# Patient Record
Sex: Male | Born: 1955 | Race: Black or African American | Hispanic: No | Smoking: Never smoker
Health system: Southern US, Community
[De-identification: ages and names within clinical notes are randomized; demographics above are authoritative.]

---

## 2006-06-09 ENCOUNTER — Emergency Department: Payer: Self-pay | Admitting: Emergency Medicine

## 2006-07-18 ENCOUNTER — Other Ambulatory Visit: Payer: Self-pay

## 2006-07-18 ENCOUNTER — Emergency Department: Payer: Self-pay

## 2006-10-09 ENCOUNTER — Emergency Department: Payer: Self-pay | Admitting: Emergency Medicine

## 2009-03-24 ENCOUNTER — Emergency Department: Payer: Self-pay | Admitting: Emergency Medicine

## 2012-01-15 ENCOUNTER — Emergency Department: Payer: Self-pay | Admitting: Emergency Medicine

## 2012-01-15 LAB — COMPREHENSIVE METABOLIC PANEL
Albumin: 3.7 g/dL (ref 3.4–5.0)
Anion Gap: 6 — ABNORMAL LOW (ref 7–16)
Bilirubin,Total: 0.7 mg/dL (ref 0.2–1.0)
Calcium, Total: 8.4 mg/dL — ABNORMAL LOW (ref 8.5–10.1)
Chloride: 107 mmol/L (ref 98–107)
Co2: 27 mmol/L (ref 21–32)
EGFR (African American): >60
EGFR (Non-African Amer.): >60
Osmolality: 279 (ref 275–301)
Potassium: 4.1 mmol/L (ref 3.5–5.1)
SGOT(AST): 26 U/L (ref 15–37)
Sodium: 140 mmol/L (ref 136–145)

## 2012-01-15 LAB — CBC
HCT: 43 % (ref 40.0–52.0)
MCHC: 34.6 g/dL (ref 32.0–36.0)
RBC: 4.8 10*6/uL (ref 4.40–5.90)
RDW: 13.7 % (ref 11.5–14.5)
WBC: 5.7 10*3/uL (ref 3.8–10.6)

## 2012-01-15 LAB — URINALYSIS, COMPLETE
Bilirubin,UR: NEGATIVE
Leukocyte Esterase: NEGATIVE
Ph: 6 (ref 4.5–8.0)
Protein: NEGATIVE
RBC,UR: NONE SEEN /HPF (ref 0–5)
WBC UR: 1 /HPF (ref 0–5)

## 2012-01-15 LAB — CK TOTAL AND CKMB (NOT AT ARMC)
CK, Total: 722 U/L — ABNORMAL HIGH (ref 35–232)
CK-MB: 2.4 ng/mL (ref 0.5–3.6)

## 2012-10-09 ENCOUNTER — Emergency Department: Payer: Self-pay | Admitting: Emergency Medicine

## 2012-10-09 LAB — COMPREHENSIVE METABOLIC PANEL
Albumin: 3.9 g/dL (ref 3.4–5.0)
Alkaline Phosphatase: 81 U/L (ref 50–136)
Anion Gap: 5 — ABNORMAL LOW (ref 7–16)
BUN: 15 mg/dL (ref 7–18)
Bilirubin,Total: 0.5 mg/dL (ref 0.2–1.0)
Calcium, Total: 8.7 mg/dL (ref 8.5–10.1)
Chloride: 103 mmol/L (ref 98–107)
Co2: 30 mmol/L (ref 21–32)
Creatinine: 1.31 mg/dL — ABNORMAL HIGH (ref 0.60–1.30)
EGFR (African American): >60
EGFR (Non-African Amer.): >60
Glucose: 87 mg/dL (ref 65–99)
Osmolality: 276 (ref 275–301)
Potassium: 3.5 mmol/L (ref 3.5–5.1)
SGOT(AST): 22 U/L (ref 15–37)
SGPT (ALT): 23 U/L (ref 12–78)
Sodium: 138 mmol/L (ref 136–145)
Total Protein: 7.3 g/dL (ref 6.4–8.2)

## 2012-10-09 LAB — CBC
HCT: 41.6 % (ref 40.0–52.0)
HGB: 14.1 g/dL (ref 13.0–18.0)
MCH: 30.4 pg (ref 26.0–34.0)
MCHC: 33.9 g/dL (ref 32.0–36.0)
MCV: 90 fL (ref 80–100)
Platelet: 138 10*3/uL — ABNORMAL LOW (ref 150–440)
RBC: 4.63 10*6/uL (ref 4.40–5.90)
RDW: 13.5 % (ref 11.5–14.5)
WBC: 9 10*3/uL (ref 3.8–10.6)

## 2012-10-09 LAB — TROPONIN I: Troponin-I: 0.03 ng/mL

## 2012-10-09 LAB — CK TOTAL AND CKMB (NOT AT ARMC)
CK, Total: 205 U/L (ref 35–232)
CK-MB: 0.7 ng/mL (ref 0.5–3.6)

## 2013-05-31 ENCOUNTER — Emergency Department: Payer: Self-pay | Admitting: Emergency Medicine

## 2013-05-31 LAB — URINALYSIS, COMPLETE
Bacteria: NONE SEEN
Bilirubin,UR: NEGATIVE
Blood: NEGATIVE
Glucose,UR: NEGATIVE mg/dL (ref 0–75)
Ketone: NEGATIVE
Leukocyte Esterase: NEGATIVE
Nitrite: NEGATIVE
Protein: NEGATIVE
RBC,UR: 1 /HPF (ref 0–5)
Squamous Epithelial: <1

## 2013-05-31 LAB — COMPREHENSIVE METABOLIC PANEL
Anion Gap: 7 (ref 7–16)
BUN: 21 mg/dL — ABNORMAL HIGH (ref 7–18)
Bilirubin,Total: 0.6 mg/dL (ref 0.2–1.0)
Calcium, Total: 8.6 mg/dL (ref 8.5–10.1)
Co2: 26 mmol/L (ref 21–32)
Creatinine: 1.38 mg/dL — ABNORMAL HIGH (ref 0.60–1.30)
EGFR (African American): >60
SGPT (ALT): 29 U/L (ref 12–78)
Total Protein: 7.7 g/dL (ref 6.4–8.2)

## 2013-05-31 LAB — TROPONIN I: Troponin-I: <0.02 ng/mL

## 2014-09-13 IMAGING — CT CT HEAD WITHOUT CONTRAST
1 series · 16 of 30 positions shown, 20 images · non-contrast
Comparison: none

REASON FOR EXAM: headache
COMMENTS:

PROCEDURE:     CT  - CT HEAD WITHOUT CONTRAST  - October 09, 2012  [DATE]
RESULT:     Comparison:  None
TECHNIQUE: Multiple axial images from the foramen magnum to the vertex were
obtained without IV contrast.

[Series 2: soft tissue · axial · 0.43mm/px · z∈[+348,+488]mm · 16 of 32 slices shown, 20 images]
[im 2/32  brain]
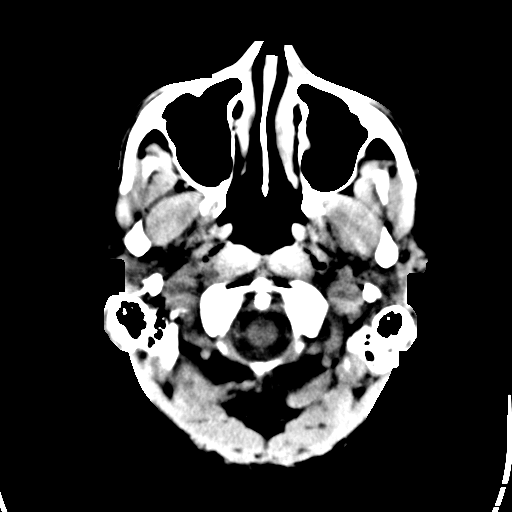
[im 2/32  bone]
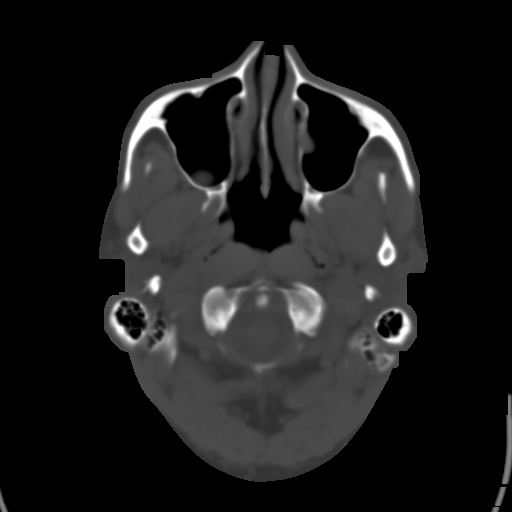
[im 4/32  brain]
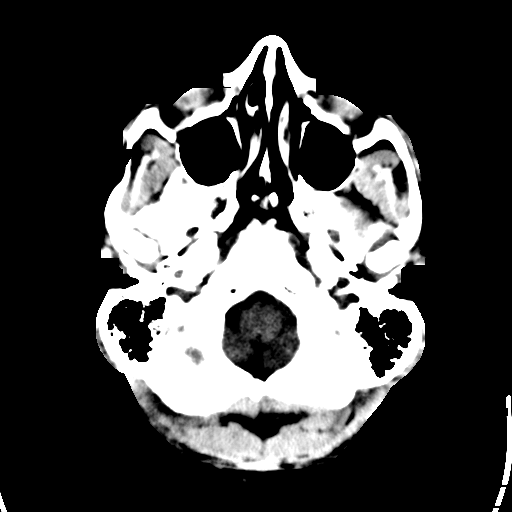
[im 6/32  brain]
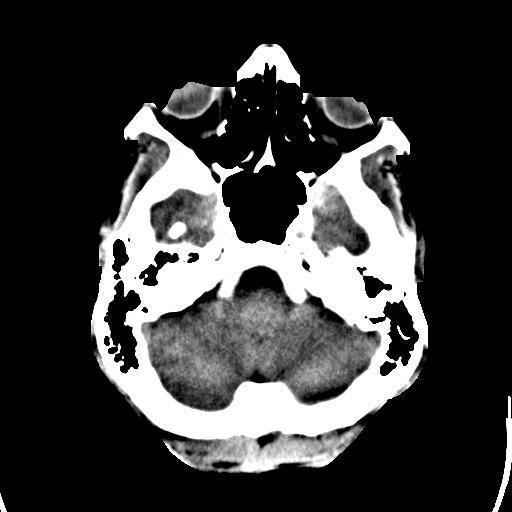
[im 8/32  brain]
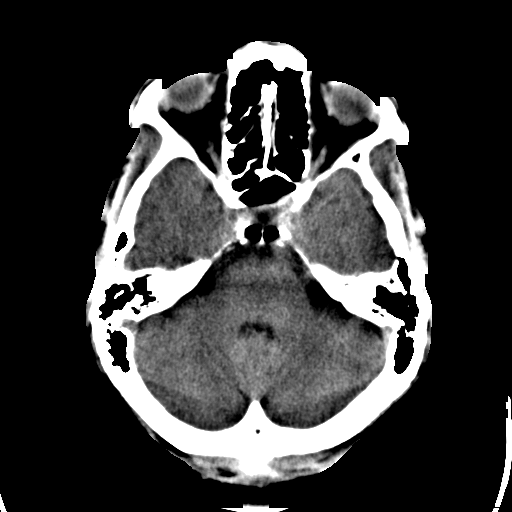
[im 9/32  brain]
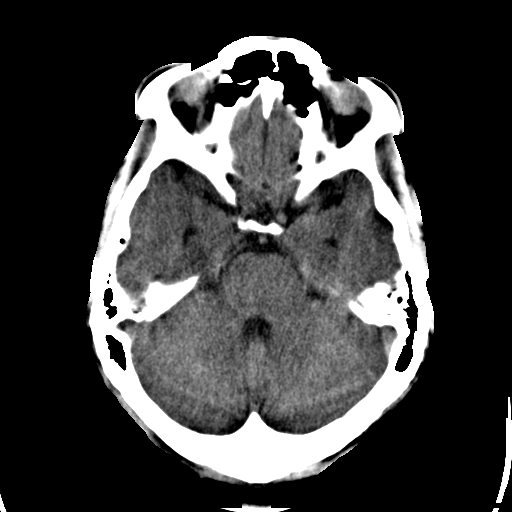
[im 9/32  bone]
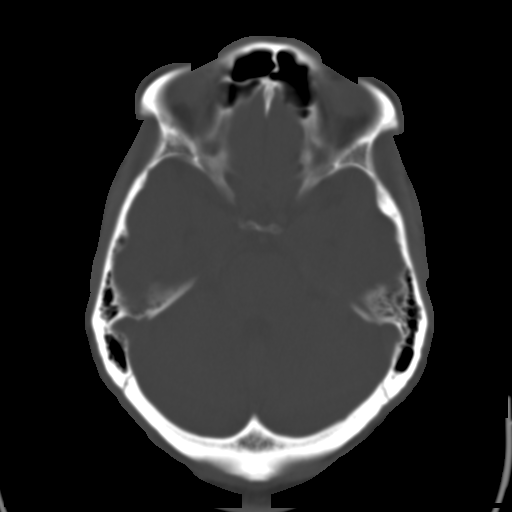
[im 11/32  brain]
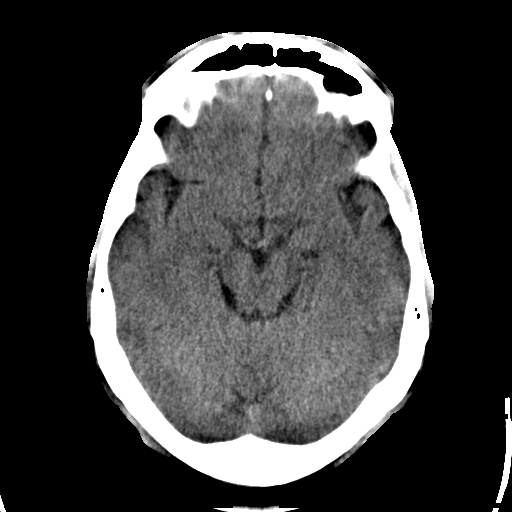
[im 13/32  brain]
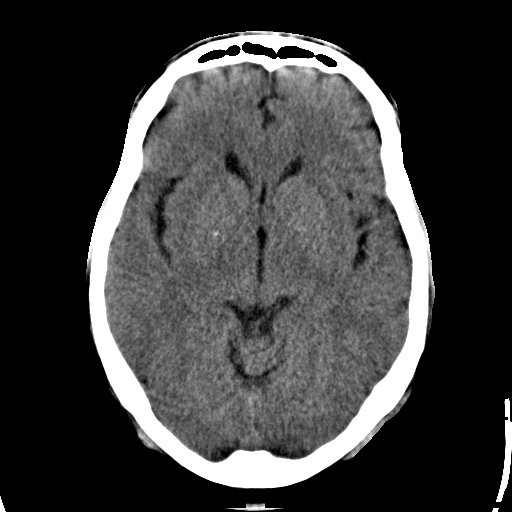
[im 15/32  brain]
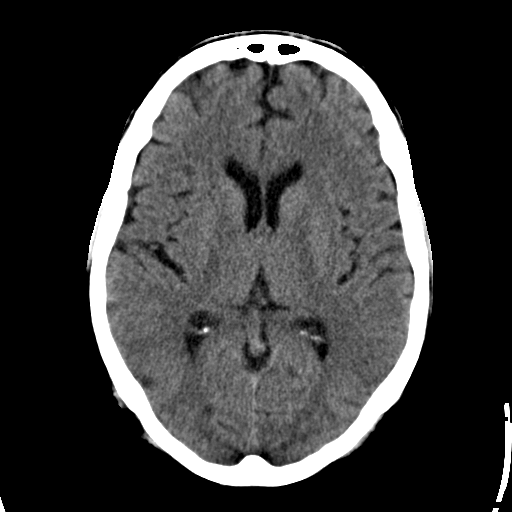
[im 17/32  brain]
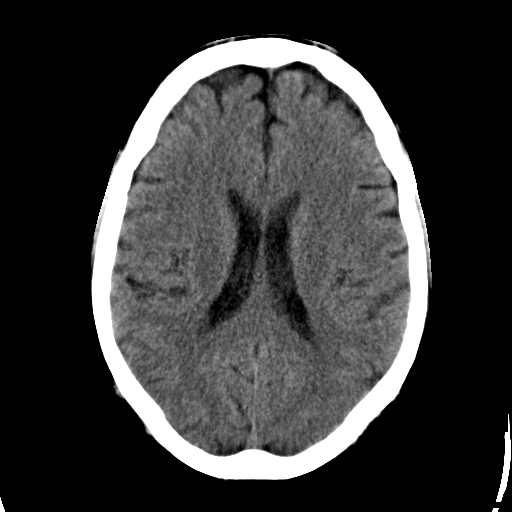
[im 17/32  bone]
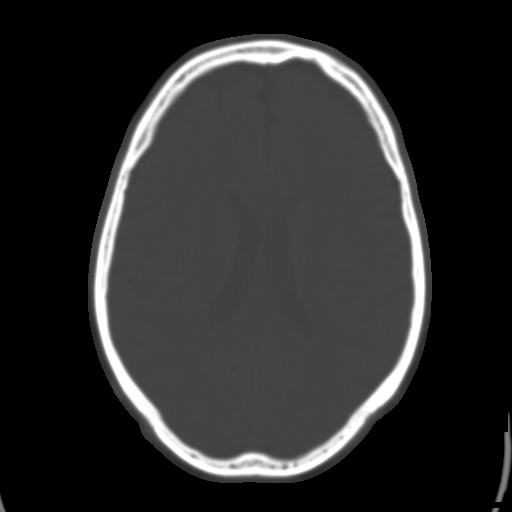
[im 19/32  brain]
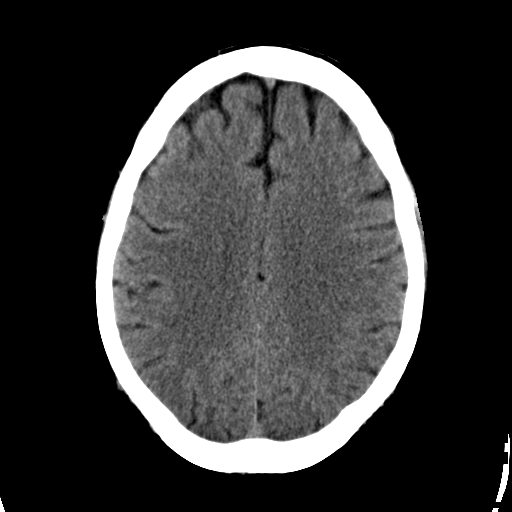
[im 21/32  brain]
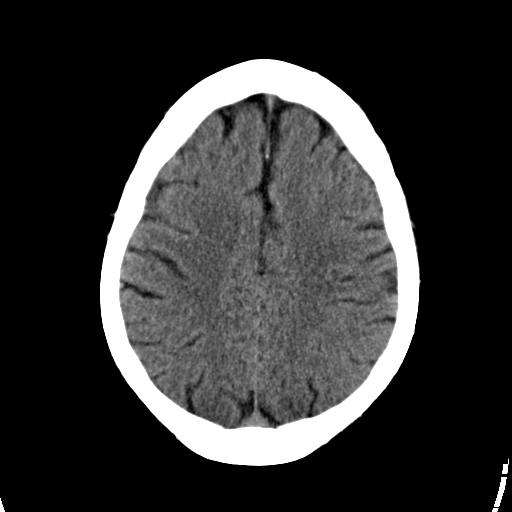
[im 23/32  brain]
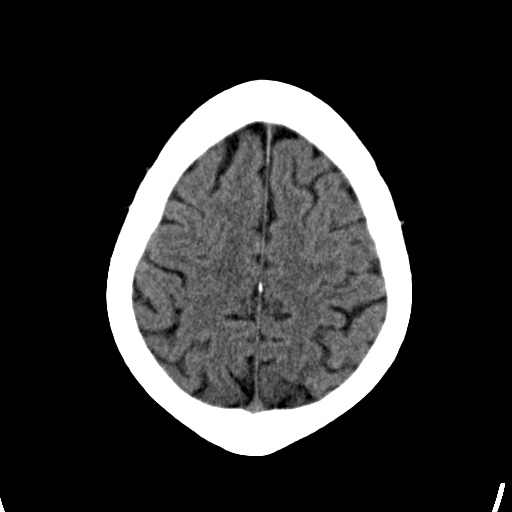
[im 24/32  brain]
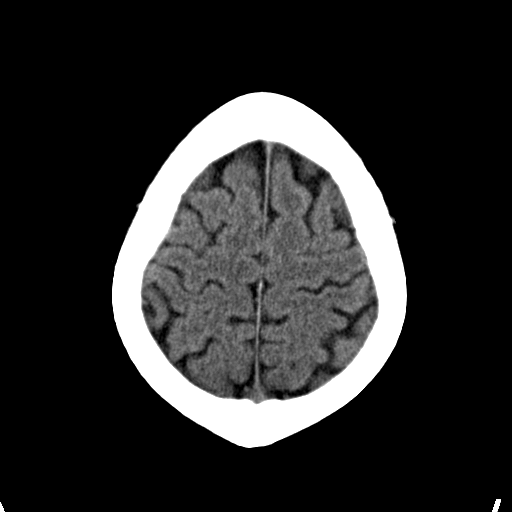
[im 24/32  bone]
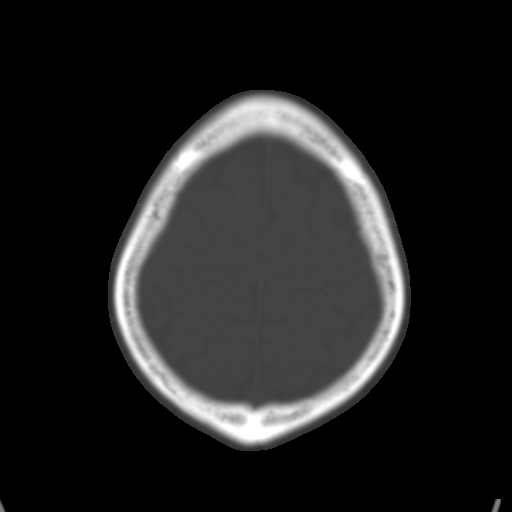
[im 26/32  brain]
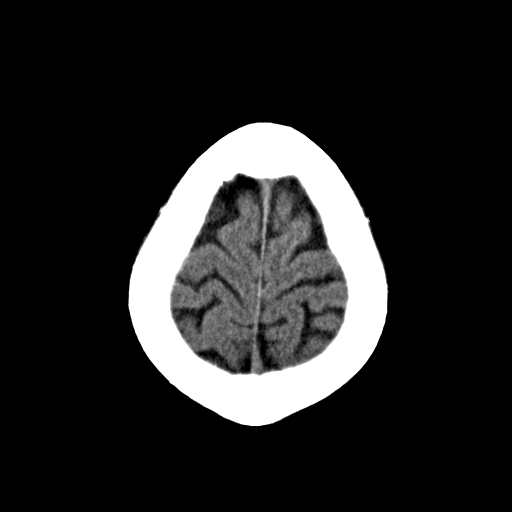
[im 28/32  brain]
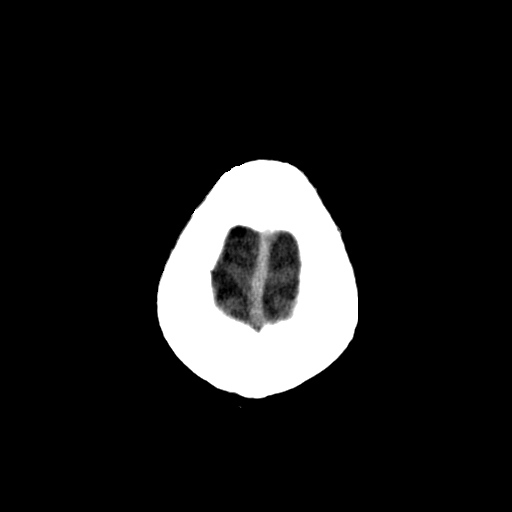
[im 30/32  brain]
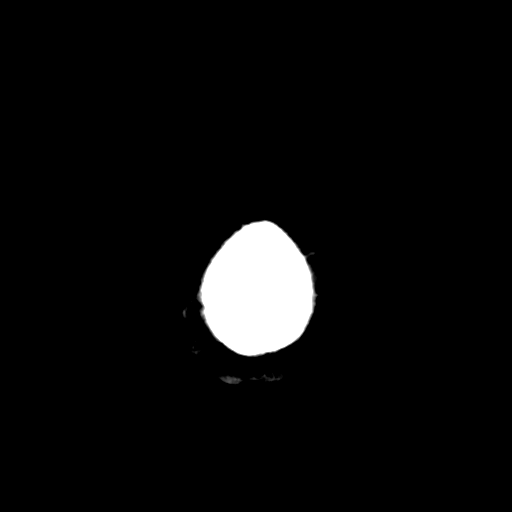

[16 of 30 positions shown; findings below may reference images not displayed]

FINDINGS: There is no evidence of mass effect, midline shift, or extra-axial fluid
collections.  There is no evidence of a space-occupying lesion or
intracranial hemorrhage. There is no evidence of a cortical-based area of
acute infarction.

The ventricles and sulci are appropriate for the patient's age. The basal
cisterns are patent.

Visualized portions of the orbits are unremarkable. There is a there is a
small right maxillary sinus mucus retention cyst.

The osseous structures are unremarkable.
IMPRESSION: No acute intracranial process.

[REDACTED]

## 2016-06-11 ENCOUNTER — Encounter: Payer: Self-pay | Admitting: Emergency Medicine

## 2016-06-11 ENCOUNTER — Emergency Department
Admission: EM | Admit: 2016-06-11 | Discharge: 2016-06-11 | Disposition: A | Discharge status: Home / Self Care | Payer: PRIVATE HEALTH INSURANCE | Attending: Emergency Medicine | Admitting: Emergency Medicine

## 2016-06-11 DIAGNOSIS — Y999 Unspecified external cause status: Secondary | ICD-10-CM | POA: Diagnosis not present

## 2016-06-11 DIAGNOSIS — S60561A Insect bite (nonvenomous) of right hand, initial encounter: Secondary | ICD-10-CM | POA: Diagnosis not present

## 2016-06-11 DIAGNOSIS — Y929 Unspecified place or not applicable: Secondary | ICD-10-CM | POA: Diagnosis not present

## 2016-06-11 DIAGNOSIS — S60562A Insect bite (nonvenomous) of left hand, initial encounter: Secondary | ICD-10-CM | POA: Diagnosis not present

## 2016-06-11 DIAGNOSIS — R21 Rash and other nonspecific skin eruption: Secondary | ICD-10-CM | POA: Diagnosis present

## 2016-06-11 DIAGNOSIS — W57XXXA Bitten or stung by nonvenomous insect and other nonvenomous arthropods, initial encounter: Secondary | ICD-10-CM | POA: Insufficient documentation

## 2016-06-11 DIAGNOSIS — L509 Urticaria, unspecified: Secondary | ICD-10-CM

## 2016-06-11 DIAGNOSIS — Y939 Activity, unspecified: Secondary | ICD-10-CM | POA: Insufficient documentation

## 2016-06-11 MED ORDER — HYDROXYZINE PAMOATE 25 MG PO CAPS: 25.0000 mg | ORAL_CAPSULE | Freq: Four times a day (QID) | ORAL | 0 refills | Status: AC | PRN

## 2016-06-11 MED ORDER — DEXAMETHASONE SODIUM PHOSPHATE 10 MG/ML IJ SOLN
10.0000 mg | Freq: Once | INTRAMUSCULAR | Status: AC
  Administered 2016-06-11: 10 mg via INTRAMUSCULAR
  Filled 2016-06-11: qty 1

## 2016-06-11 NOTE — ED Triage Notes (Signed)
Patient presents to the ED with rash to his bilateral hands and wrists after getting bit by ants yesterday.  Very small occasional bumps can be seen to wrist.  No obvious distress at this time.

## 2016-06-11 NOTE — ED Provider Notes (Signed)
[facility] Emergency Department Provider Note  ____________________________________________  Time seen: Approximately 3:09 PM  I have reviewed the triage vital signs and the nursing notes.   HISTORY  Chief Complaint Rash    HPI James RaringStacey L Wallenstein Sr. is a 60 y.o. male , NAD, presents to the right prominent one-day history of rash, itching and swelling about his bilateral hands. Patient states he was bitten multiple times by large black ants yesterday. States the areas have been itching significantly but today he woke with swelling about the back of both hands. Denies any bites, swelling or redness to any other part of his body. Has had no numbness, wheezes, tingling. Denies any oozing or weeping. No headaches, fevers, chills or body aches. Has had no chest pain, shortness breath, swelling about the lips/tongue/throat. Has not taken anything over-the-counter for his symptoms at this time.   History reviewed. No pertinent past medical history.  There are no active problems to display for this patient.   History reviewed. No pertinent surgical history.  Prior to Admission medications   Medication Sig Start Date End Date Taking? Authorizing Provider  hydrOXYzine (VISTARIL) 25 MG capsule Take 1 capsule (25 mg total) by mouth every 6 (six) hours as needed for itching. 06/11/16   Fenna Semel L Latressa Harries, PA-C    Allergies Patient has no known allergies.  No family history on file.  Social History Social History  Substance Use Topics  . Smoking status: Never Smoker  . Smokeless tobacco: Never Used  . Alcohol use No     Review of Systems  Constitutional: No fever/chills ENT: No Swelling about the lips/tongue/throat. Cardiovascular: No chest pain. Respiratory: No shortness of breath. Musculoskeletal: Negative for Forearm, wrist, hand, finger pain.  Skin: Positive for rash and swelling about bilateral hands. No oozing, weeping or bleeding. Neurological: Negative  for Numbness, weakness, tingling  ____________________________________________   PHYSICAL EXAM:  VITAL SIGNS: ED Triage Vitals  Enc Vitals Group     BP 06/11/16 1430 (!) 164/105     Pulse Rate 06/11/16 1430 77     Resp 06/11/16 1430 18     Temp 06/11/16 1430 97.8 F (36.6 C)     Temp Source 06/11/16 1430 Oral     SpO2 06/11/16 1430 97 %     Weight 06/11/16 1426 210 lb (95.3 kg)     Height 06/11/16 1426 6' (1.829 m)     Head Circumference --      Peak Flow --      Pain Score 06/11/16 1426 6     Pain Loc --      Pain Edu? --      Excl. in GC? --      Constitutional: Alert and oriented. Well appearing and in no acute distress. Eyes: Conjunctivae are normal.  Head: Atraumatic. Neck: Supple with full range of motion. Hematological/Lymphatic/Immunilogical: No cervical lymphadenopathy. Cardiovascular: Good peripheral circulation with 2+ pulses noted in bilateral upper extremities. Capillary refill is brisk in all digits of bilateral hands. Respiratory: Normal respiratory effort without tachypnea or retractions.  Musculoskeletal: Full range of motion of bilateral upper extremities without pain or difficulty. Neurologic:  Normal speech and language. No gross focal neurologic deficits are appreciated.  Skin:  Multiple papular lesions noted about the bilateral distal forearms, wrists, hands and fingers. Mild edema noted about the dorsal portion of the left hand and wrist. Mild tenderness to palpation of this area but otherwise no other tenderness. No active oozing, weeping or bleeding. Skin  is warm, dry and intact.  Psychiatric: Mood and affect are normal. Speech and behavior are normal. Patient exhibits appropriate insight and judgement.   ____________________________________________    LABS  None ____________________________________________  EKG  None ____________________________________________  RADIOLOGY  None ____________________________________________    PROCEDURES  Procedure(s) performed: None   Procedures   Medications  dexamethasone (DECADRON) injection 10 mg (10 mg Intramuscular Given 06/11/16 1546)    ____________________________________________   INITIAL IMPRESSION / ASSESSMENT AND PLAN / ED COURSE  Pertinent labs & imaging results that were available during my care of the patient were reviewed by me and considered in my medical decision making (see chart for details).  Clinical Course     Patient's diagnosis is consistent with Insect bites causing urticaria. Patient was given IM Decadron in the emergency department and tolerated well without side effects. Patient will be discharged home with prescriptions for Vistaril to use as needed for itching. [facility] if symptoms persist past this treatment course. Patient is given ED precautions to return to the ED for any worsening or new symptoms.    ____________________________________________  FINAL CLINICAL IMPRESSION(S) / ED DIAGNOSES  Final diagnoses:  Insect bite, initial encounter  Urticaria      NEW MEDICATIONS STARTED DURING THIS VISIT:  New Prescriptions   HYDROXYZINE (VISTARIL) 25 MG CAPSULE    Take 1 capsule (25 mg total) by mouth every 6 (six) hours as needed for itching.         Hope PigeonJami L Rilya Longo, PA-C 06/11/16 1617    [name] Matthew Schaevitz, MD 06/11/16 1728

## 2021-09-19 ENCOUNTER — Other Ambulatory Visit: Payer: Self-pay

## 2021-09-19 ENCOUNTER — Encounter: Payer: Self-pay | Admitting: Emergency Medicine

## 2021-09-19 ENCOUNTER — Emergency Department: Payer: No Typology Code available for payment source

## 2021-09-19 DIAGNOSIS — S99912A Unspecified injury of left ankle, initial encounter: Secondary | ICD-10-CM | POA: Diagnosis present

## 2021-09-19 DIAGNOSIS — X509XXA Other and unspecified overexertion or strenuous movements or postures, initial encounter: Secondary | ICD-10-CM | POA: Insufficient documentation

## 2021-09-19 DIAGNOSIS — S93402A Sprain of unspecified ligament of left ankle, initial encounter: Secondary | ICD-10-CM | POA: Diagnosis not present

## 2021-09-19 NOTE — ED Triage Notes (Signed)
Pt ambulatory to triage, limping gait with aid of cane; st sprained his left ankle today after stepping into a hole ?

## 2021-09-20 ENCOUNTER — Emergency Department
Admission: EM | Admit: 2021-09-20 | Discharge: 2021-09-20 | Disposition: A | Discharge status: Home / Self Care | Payer: No Typology Code available for payment source | Attending: Emergency Medicine | Admitting: Emergency Medicine

## 2021-09-20 DIAGNOSIS — S93402A Sprain of unspecified ligament of left ankle, initial encounter: Secondary | ICD-10-CM

## 2021-09-20 MED ORDER — KETOROLAC TROMETHAMINE 60 MG/2ML IM SOLN
30.0000 mg | Freq: Once | INTRAMUSCULAR | Status: AC
  Administered 2021-09-20: 30 mg via INTRAMUSCULAR
  Filled 2021-09-20: qty 2

## 2021-09-20 MED ORDER — HYDROCODONE-ACETAMINOPHEN 5-325 MG PO TABS: 1.0000 | ORAL_TABLET | Freq: Four times a day (QID) | ORAL | 0 refills | Status: AC | PRN

## 2021-09-20 NOTE — Discharge Instructions (Signed)
1.  You may take ibuprofen as needed for pain; Norco as needed for more severe pain. ?2.  Elevated affected area and apply ice over splint several times daily. ?3.  Use splint as needed for comfort. ?4.  Return to the ER for worsening symptoms, persistent vomiting, difficulty breathing or other concerns. ?

## 2021-09-20 NOTE — ED Notes (Signed)
Aircast applied to left ankle.

## 2021-09-20 NOTE — ED Provider Notes (Signed)
? ?[facility] ?Provider Note ? ? ? Event Date/Time  ? First MD Initiated Contact with Patient 09/20/21 [phone]   ?  (approximate) ? ? ?History  ? ?Ankle Pain ? ? ?HPI ? ?James Raring Sr. is a 66 y.o. male who presents to the ED from home with a chief complaint of left ankle injury.  Patient ambulates with a cane, was at his granddaughter's track meet yesterday afternoon when he stepped in a hole and twisted his left ankle.  Presents with pain and swelling to that ankle.  Has a boot from a prior injury which he is wearing which helps with the pain somewhat.  Ambulates with cane at baseline.  Denies striking head or LOC.  Voices no other complaints or injuries. ?  ? ? ?Past Medical History  ?History reviewed. No pertinent past medical history. ? ? ?Active Problem List  ?There are no problems to display for this patient. ? ? ? ?Past Surgical History  ?History reviewed. No pertinent surgical history. ? ? ?Home Medications  ? ?Prior to Admission medications   ?Medication Sig Start Date End Date Taking? Authorizing Provider  ?HYDROcodone-acetaminophen (NORCO) 5-325 MG tablet Take 1 tablet by mouth every 6 (six) hours as needed for moderate pain. 09/20/21  Yes Irean Hong, MD  ?hydrOXYzine (VISTARIL) 25 MG capsule Take 1 capsule (25 mg total) by mouth every 6 (six) hours as needed for itching. 06/11/16   Hagler, Jami L, PA-C  ? ? ? ?Allergies  ?Patient has no known allergies. ? ? ?Family History  ?No family history on file. ? ? ?Physical Exam  ?Triage Vital Signs: ?ED Triage Vitals  ?Enc Vitals Group  ?   BP 09/19/21 2309 132/85  ?   Pulse Rate 09/19/21 2309 (!) 53  ?   Resp 09/19/21 2309 18  ?   Temp 09/19/21 2309 97.9 ?F (36.6 ?C)  ?   Temp Source 09/19/21 2309 Oral  ?   SpO2 09/19/21 2309 93 %  ?   Weight 09/19/21 2303 201 lb (91.2 kg)  ?   Height 09/19/21 2303 6' (1.829 m)  ?   Head Circumference --   ?   Peak Flow --   ?   Pain Score 09/19/21 2303 10  ?   Pain Loc --   ?   Pain Edu? --   ?    Excl. in GC? --   ? ? ?Updated Vital Signs: ?BP 132/85 (BP Location: Right Arm)   Pulse (!) 53   Temp 97.9 ?F (36.6 ?C) (Oral)   Resp 18   Ht 6' (1.829 m)   Wt 91.2 kg   SpO2 93%   BMI 27.26 kg/m?  ? ? ?General: Awake, no distress.  ?CV:  Good peripheral perfusion.  ?Resp:  Normal effort.  ?Abd:  No distention.  ?Other:  Boot removed from left lower leg: Mild swelling to lateral malleolus which is tender to palpation.  Limited range of motion secondary to pain.  2+ distal pulses.  Brisk, less than 5-second capillary refill. ? ? ?ED Results / Procedures / Treatments  ?Labs ?(all labs ordered are listed, but only abnormal results are displayed) ?Labs Reviewed - No data to display ? ? ?EKG ? ?None ? ? ?RADIOLOGY ?I have independently visualized and reviewed patient's ankle x-ray as well as noted the radiology interpretation: ? ?Left ankle x-ray: No acute bony abnormalities ? ?Official radiology report(s): ?DG Ankle Complete Left ? ?Result Date: 09/19/2021 ?CLINICAL DATA:  Injury. Limping gait. Sprained ankle after stepping into a hole today. EXAM: LEFT ANKLE COMPLETE - 3+ VIEW COMPARISON:  None. FINDINGS: Left ankle appears intact. Fixation screws demonstrated in the first and fifth metatarsal bones. No evidence of acute fracture or subluxation. No focal bone lesion or bone destruction. Bone cortex and trabecular architecture appear intact. Vascular calcifications. No radiopaque soft tissue foreign bodies. IMPRESSION: No acute bony abnormalities. Electronically Signed   By: Burman Nieves M.D.   On: 09/19/2021 23:58   ? ? ?PROCEDURES: ? ?Critical Care performed: No ? ?Procedures ? ? ?MEDICATIONS ORDERED IN ED: ?Medications  ?ketorolac (TORADOL) injection 30 mg (has no administration in time range)  ? ? ? ?IMPRESSION / MDM / ASSESSMENT AND PLAN / ED COURSE  ?I reviewed the triage vital signs and the nursing notes. ?             ?               ?66 year old male who presents with left ankle sprain.  He is driving  so we will give IM ketorolac now.  Will discharge home with prescription for Norco to use as needed.  We will follow-up with podiatry.  Gave patient the choice to continue to use his boot which is rather bulky or try ankle stirrup brace which patient prefers.  Offered walker but states he will stick with his cane.  Strict return precautions given.  Patient verbalizes understanding and agrees with plan of care. ? ?FINAL CLINICAL IMPRESSION(S) / ED DIAGNOSES  ? ?Final diagnoses:  ?Sprain of left ankle, unspecified ligament, initial encounter  ? ? ? ?Rx / DC Orders  ? ?ED Discharge Orders   ? ?      Ordered  ?  HYDROcodone-acetaminophen (NORCO) 5-325 MG tablet  Every 6 hours PRN       ? 09/20/21 0227  ? ?  ?  ? ?  ? ? ? ?Note:  This document was prepared using Dragon voice recognition software and may include unintentional dictation errors. ?  ?Irean Hong, MD ?09/20/21 [phone] ? ?

## 2022-03-31 ENCOUNTER — Other Ambulatory Visit: Payer: Self-pay

## 2022-03-31 DIAGNOSIS — T83098A Other mechanical complication of other indwelling urethral catheter, initial encounter: Secondary | ICD-10-CM | POA: Insufficient documentation

## 2022-03-31 DIAGNOSIS — I10 Essential (primary) hypertension: Secondary | ICD-10-CM | POA: Diagnosis not present

## 2022-03-31 DIAGNOSIS — N4889 Other specified disorders of penis: Secondary | ICD-10-CM | POA: Insufficient documentation

## 2022-03-31 LAB — CBC WITH DIFFERENTIAL/PLATELET
Abs Immature Granulocytes: 0.03 10*3/uL (ref 0.00–0.07)
Basophils Absolute: 0.1 10*3/uL (ref 0.0–0.1)
Basophils Relative: 1 %
Eosinophils Absolute: 0.2 10*3/uL (ref 0.0–0.5)
Eosinophils Relative: 2 %
HCT: 38.7 % — ABNORMAL LOW (ref 39.0–52.0)
Hemoglobin: 13.1 g/dL (ref 13.0–17.0)
Immature Granulocytes: 0 %
Lymphocytes Relative: 40 %
Lymphs Abs: 4.1 10*3/uL — ABNORMAL HIGH (ref 0.7–4.0)
MCH: 30.5 pg (ref 26.0–34.0)
MCHC: 33.9 g/dL (ref 30.0–36.0)
MCV: 90.2 fL (ref 80.0–100.0)
Monocytes Absolute: 0.6 10*3/uL (ref 0.1–1.0)
Monocytes Relative: 6 %
Neutro Abs: 5.4 10*3/uL (ref 1.7–7.7)
Neutrophils Relative %: 51 %
Platelets: 145 10*3/uL — ABNORMAL LOW (ref 150–400)
RBC: 4.29 MIL/uL (ref 4.22–5.81)
RDW: 13.9 % (ref 11.5–15.5)
WBC: 10.3 10*3/uL (ref 4.0–10.5)
nRBC: 0 % (ref 0.0–0.2)

## 2022-03-31 LAB — URINALYSIS, ROUTINE W REFLEX MICROSCOPIC
Bilirubin Urine: NEGATIVE
Glucose, UA: NEGATIVE mg/dL
Ketones, ur: NEGATIVE mg/dL
Nitrite: NEGATIVE
Protein, ur: NEGATIVE mg/dL
Specific Gravity, Urine: 1.01 (ref 1.005–1.030)
pH: 6 (ref 5.0–8.0)

## 2022-03-31 LAB — COMPREHENSIVE METABOLIC PANEL
ALT: 21 U/L (ref 0–44)
AST: 27 U/L (ref 15–41)
Albumin: 3.9 g/dL (ref 3.5–5.0)
Alkaline Phosphatase: 65 U/L (ref 38–126)
Anion gap: 5 (ref 5–15)
BUN: 26 mg/dL — ABNORMAL HIGH (ref 8–23)
CO2: 25 mmol/L (ref 22–32)
Calcium: 8.6 mg/dL — ABNORMAL LOW (ref 8.9–10.3)
Chloride: 107 mmol/L (ref 98–111)
Creatinine, Ser: 1.56 mg/dL — ABNORMAL HIGH (ref 0.61–1.24)
GFR, Estimated: 49 mL/min — ABNORMAL LOW (ref >60)
Glucose, Bld: 126 mg/dL — ABNORMAL HIGH (ref 70–99)
Potassium: 3.7 mmol/L (ref 3.5–5.1)
Sodium: 137 mmol/L (ref 135–145)
Total Bilirubin: 0.5 mg/dL (ref 0.3–1.2)
Total Protein: 7.1 g/dL (ref 6.5–8.1)

## 2022-03-31 NOTE — ED Triage Notes (Signed)
Pt reports having prostate surgery on Tuesday and being discharged home with foley. Reports increased urinary output today with need to change/empty bag 4 times today. Denies hematuria. Pt denies abd pain or pelvic pain.

## 2022-03-31 NOTE — ED Provider Triage Note (Signed)
Emergency Medicine Provider Triage Evaluation Note  James Sark Sr. , a 66 y.o. male  was evaluated in triage.  Pt complains of increased urinary output in his Foley catheter.  Patient recently had prostate surgery, states that he is now filling up his urinary leg bag in a much more rapid rate than before.  No abdominal pain, flank pain, fevers..  Review of Systems  Positive: Increased urinary output Negative: Fever, chill, abd pain, flank pain  Physical Exam  BP 110/74 (BP Location: Left Arm)   Pulse 78   Temp 97.9 F (36.6 C) (Oral)   Resp 18   Ht 6' (1.829 m)   Wt 93.9 kg   SpO2 94%   BMI 28.07 kg/m  Gen:   Awake, no distress   Resp:  Normal effort  MSK:   Moves extremities without difficulty  Other:  No abd tenderness on exam  Medical Decision Making  Medically screening exam initiated at 8:19 PM.  Appropriate orders placed.  James Sark Sr. was informed that the remainder of the evaluation will be completed by another provider, this initial triage assessment does not replace that evaluation, and the importance of remaining in the ED until their evaluation is complete.  Labs, urinalysis, urine culture   Darletta Moll, PA-C 03/31/22 2019

## 2022-04-01 ENCOUNTER — Emergency Department
Admission: EM | Admit: 2022-04-01 | Discharge: 2022-04-01 | Disposition: A | Discharge status: Home / Self Care | Payer: No Typology Code available for payment source | Attending: Emergency Medicine | Admitting: Emergency Medicine

## 2022-04-01 DIAGNOSIS — N4889 Other specified disorders of penis: Secondary | ICD-10-CM

## 2022-04-01 DIAGNOSIS — Z978 Presence of other specified devices: Secondary | ICD-10-CM

## 2022-04-01 NOTE — Discharge Instructions (Signed)
Your lab tests and evaluation in the emergency department were all okay.  Continue following up with your urologist and primary care as previously planned.

## 2022-04-01 NOTE — ED Notes (Signed)
Pt bladder scanned at bedside, 0 mL in bladder per scan.

## 2022-04-01 NOTE — ED Provider Notes (Signed)
[facility] Provider Note    Event Date/Time   First MD Initiated Contact with Patient 04/01/22 [phone]     (approximate)   History   Post-op Problem   HPI  James Petty Sr. is a 66 y.o. male with a history of hypertension and recent TURP 4 days ago who comes ED complaining of excessive urine output in his Foley collection leg bag.  Denies any pain or fever.  No hematuria.  Eating and drinking okay.  Denies diuretic or alcohol use.       Physical Exam   Triage Vital Signs: ED Triage Vitals  Enc Vitals Group     BP 03/31/22 2014 110/74     Pulse Rate 03/31/22 2014 78     Resp 03/31/22 2014 18     Temp 03/31/22 2014 97.9 F (36.6 C)     Temp Source 03/31/22 2014 Oral     SpO2 03/31/22 2014 94 %     Weight 03/31/22 2018 207 lb (93.9 kg)     Height 03/31/22 2018 6' (1.829 m)     Head Circumference --      Peak Flow --      Pain Score 03/31/22 2018 2     Pain Loc --      Pain Edu? --      Excl. in [place]? --     Most recent vital signs: Vitals:   03/31/22 2014 04/01/22 0307  BP: 110/74 (!) 152/94  Pulse: 78 (!) 57  Resp: 18 18  Temp: 97.9 F (36.6 C) 97.9 F (36.6 C)  SpO2: 94% 99%    General: Awake, no distress.  CV:  Good peripheral perfusion.  Resp:  Normal effort.  Abd:  No distention.  Soft nontender. Other:  Normal genitalia.  Foley catheter in place, well-seated.  No active bleeding.  Clear urine in the collection bag.  Bladder scan 0 mm   ED Results / Procedures / Treatments   Labs (all labs ordered are listed, but only abnormal results are displayed) Labs Reviewed  COMPREHENSIVE METABOLIC PANEL - Abnormal; Notable for the following components:      Result Value   Glucose, Bld 126 (*)    BUN 26 (*)    Creatinine, Ser 1.56 (*)    Calcium 8.6 (*)    GFR, Estimated 49 (*)    All other components within normal limits  CBC WITH DIFFERENTIAL/PLATELET - Abnormal; Notable for the following components:   HCT 38.7 (*)     Platelets 145 (*)    Lymphs Abs 4.1 (*)    All other components within normal limits  URINALYSIS, ROUTINE W REFLEX MICROSCOPIC - Abnormal; Notable for the following components:   Color, Urine STRAW (*)    APPearance CLEAR (*)    Hgb urine dipstick LARGE (*)    Leukocytes,Ua SMALL (*)    Bacteria, UA RARE (*)    All other components within normal limits  URINE CULTURE     RADIOLOGY    PROCEDURES:  Procedures   MEDICATIONS ORDERED IN ED: Medications - No data to display   IMPRESSION / MDM / [place] / ED COURSE  I reviewed the triage vital signs and the nursing notes.                              Differential diagnosis includes, but is not limited to, patient comes ED concerned that he  is having excessive urine output in his Foley collection bag and having to empty it frequently.  Counseled him that leg bag holds very little volume.  I suspect he is also having some spontaneous diuresis after recent procedure and anesthesia.  Labs are all okay, he is nontoxic with no acute symptoms, stable to continue his existing plan of care with outpatient follow-up.       FINAL CLINICAL IMPRESSION(S) / ED DIAGNOSES   Final diagnoses:  Penile bleeding  Foley catheter in place     Rx / DC Orders   ED Discharge Orders     None        Note:  This document was prepared using Dragon voice recognition software and may include unintentional dictation errors.   Carrie Mew, MD 04/01/22 [phone]

## 2022-04-02 LAB — URINE CULTURE: Culture: NO GROWTH

## 2023-05-30 ENCOUNTER — Emergency Department: Payer: No Typology Code available for payment source

## 2023-05-30 ENCOUNTER — Other Ambulatory Visit: Payer: Self-pay

## 2023-05-30 ENCOUNTER — Emergency Department
Admission: EM | Admit: 2023-05-30 | Discharge: 2023-05-30 | Disposition: A | Discharge status: Home / Self Care | Payer: No Typology Code available for payment source | Attending: Emergency Medicine | Admitting: Emergency Medicine

## 2023-05-30 DIAGNOSIS — M79661 Pain in right lower leg: Secondary | ICD-10-CM | POA: Diagnosis present

## 2023-05-30 DIAGNOSIS — Z8546 Personal history of malignant neoplasm of prostate: Secondary | ICD-10-CM | POA: Diagnosis not present

## 2023-05-30 NOTE — Discharge Instructions (Addendum)
No evidence of DVT. Please follow up with your regular doctor for ongoing care.

## 2023-05-30 NOTE — ED Provider Notes (Signed)
[facility] Provider Note    Event Date/Time   First MD Initiated Contact with Patient 05/30/23 1331     (approximate)   History   Leg Pain   HPI JWAN MILLAY Sr. is a 67 y.o. male presenting today for right leg pain.  Patient states for the past 2 months she has had intermittent pain behind his right calf.  States the pain is now moving onto the medial side of his calf.  Has noticed a little bit of swelling in that leg as well.  Denies any direct trauma.  No redness or warmth present.  Still able to ambulate on it.  No prior history of blood clots.  Chart review: Patient does have a prior history of prostate cancer with surgery 1 year ago.  Recently had additional biopsy to see if it was in remission or not.  Not currently on any chemotherapy or radiation otherwise.     Physical Exam   Triage Vital Signs: ED Triage Vitals  Encounter Vitals Group     BP 05/30/23 1327 (!) 137/98     Systolic BP Percentile --      Diastolic BP Percentile --      Pulse Rate 05/30/23 1327 64     Resp 05/30/23 1327 16     Temp 05/30/23 1325 97.7 F (36.5 C)     Temp src --      SpO2 05/30/23 1327 99 %     Weight 05/30/23 1328 197 lb (89.4 kg)     Height 05/30/23 1328 6' (1.829 m)     Head Circumference --      Peak Flow --      Pain Score 05/30/23 1327 5     Pain Loc --      Pain Education --      Exclude from Growth Chart --     Most recent vital signs: Vitals:   05/30/23 1325 05/30/23 1327  BP:  (!) 137/98  Pulse:  64  Resp:  16  Temp: 97.7 F (36.5 C)   SpO2:  99%   I have reviewed the vital signs. General:  Awake, alert, no acute distress. Head:  Normocephalic, Atraumatic. EENT:  PERRL, EOMI, Oral mucosa pink and moist, Neck is supple. Cardiovascular: Regular rate, 2+ distal pulses. Respiratory:  Normal respiratory effort, symmetrical expansion, no distress.   Extremities:  Moving all four extremities through full ROM without pain.  Mild pain to  palpation behind right calf and right medial calf. Trace ankle edema. No erythema Neuro:  Alert and oriented.  Interacting appropriately.   Skin:  Warm, dry, no rash.   Psych: Appropriate affect.    ED Results / Procedures / Treatments   Labs (all labs ordered are listed, but only abnormal results are displayed) Labs Reviewed - No data to display   EKG    RADIOLOGY No evidence of DVT   PROCEDURES:  Critical Care performed: No  Procedures   MEDICATIONS ORDERED IN ED: Medications - No data to display   IMPRESSION / MDM / ASSESSMENT AND PLAN / ED COURSE  I reviewed the triage vital signs and the nursing notes.                              Differential diagnosis includes, but is not limited to, DVT, muscular strain  Patient's presentation is most consistent with acute complicated illness / injury requiring diagnostic workup.  Patient is a 67 year old male presenting today for right leg pain.  Symptoms been present for 1 to 2 months.  Exam with some tenderness around the calf otherwise mostly unremarkable.  Will get ultrasound to rule out blood clot. This showed no evidence of DVT. Patient otherwise ambulating and in no acute distress. Safe for discharge and follow up with PCP.     FINAL CLINICAL IMPRESSION(S) / ED DIAGNOSES   Final diagnoses:  Right calf pain     Rx / DC Orders   ED Discharge Orders     None        Note:  This document was prepared using Dragon voice recognition software and may include unintentional dictation errors.   Janith Lima, MD 05/30/23 1515

## 2023-05-30 NOTE — ED Triage Notes (Signed)
Pt to ED for right leg pain x2 months. Reports swelling in knee area. States was doing PT for it. Sent from [facility] to rule out DVT

## 2023-08-24 IMAGING — DX DG ANKLE COMPLETE 3+V*L*
3 series · 3 of 3 positions shown · non-contrast
Comparison: None.

CLINICAL DATA: Injury. Limping gait. Sprained ankle after stepping
into a hole today.

EXAM:
LEFT ANKLE COMPLETE - 3+ VIEW

[ankle ap]
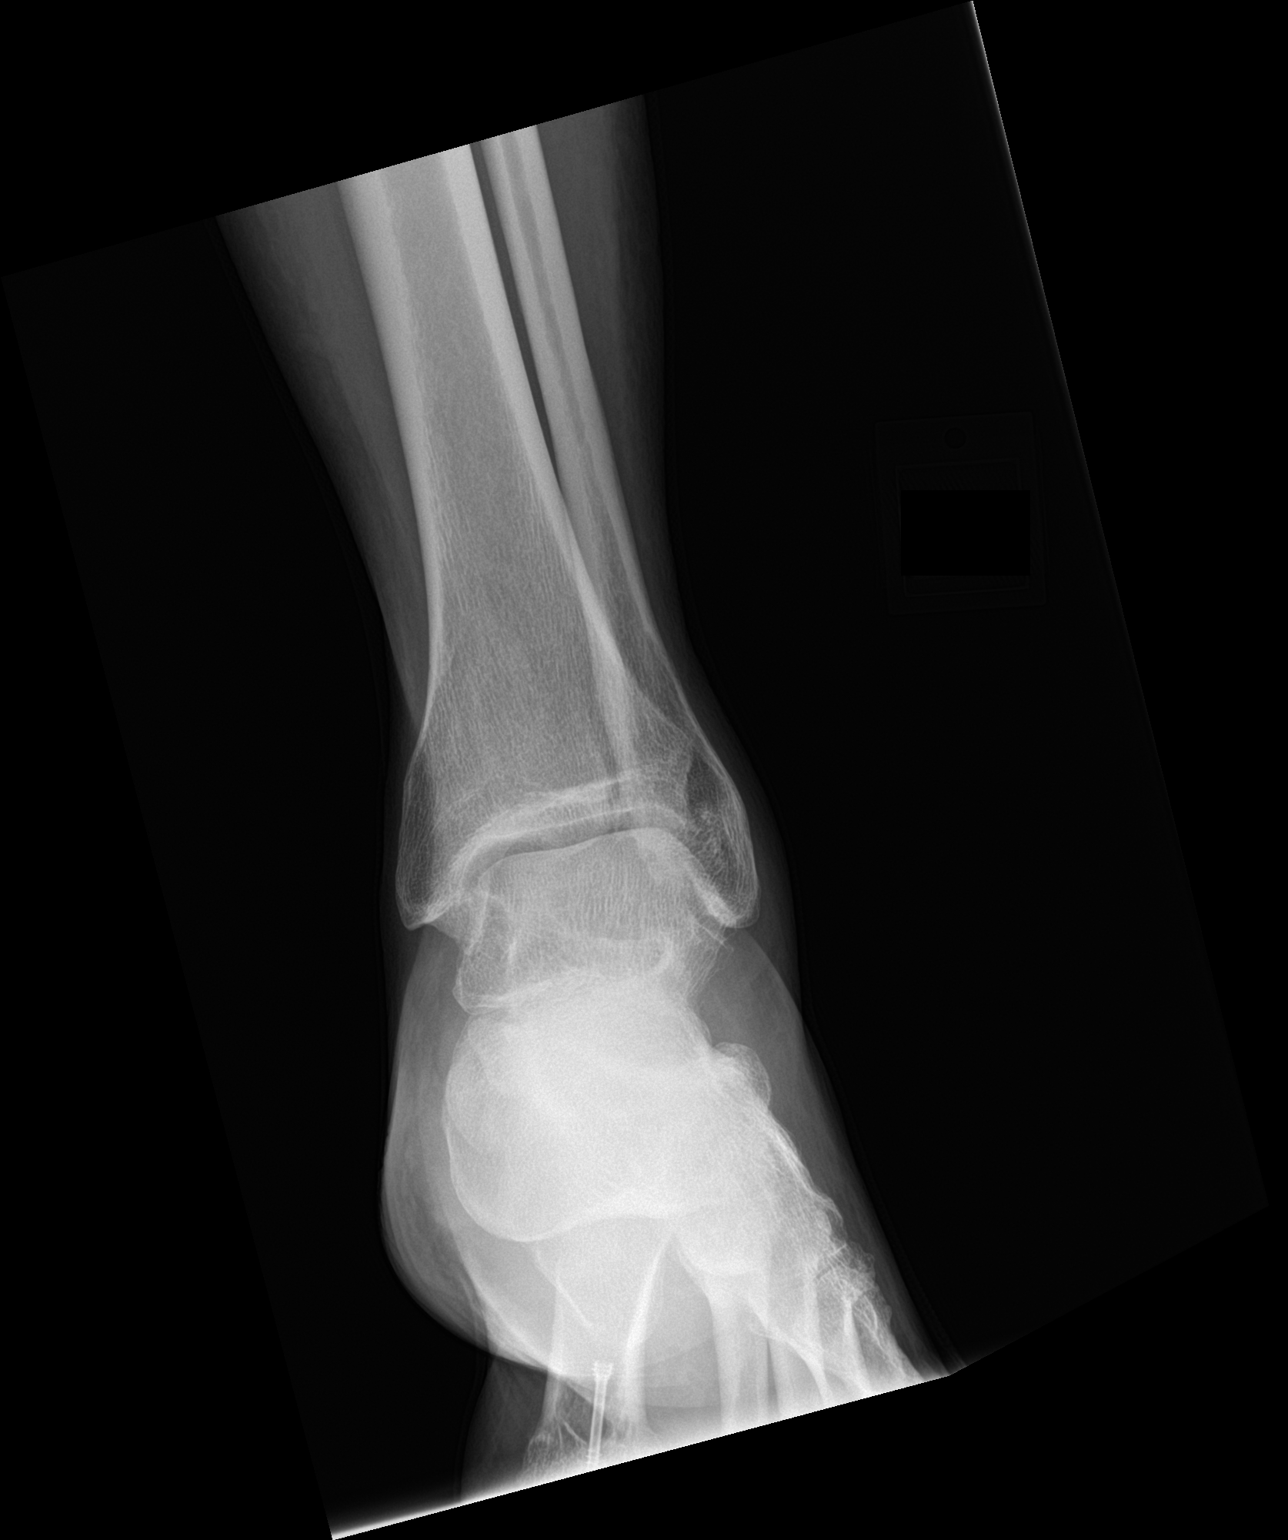

[ankle obl]
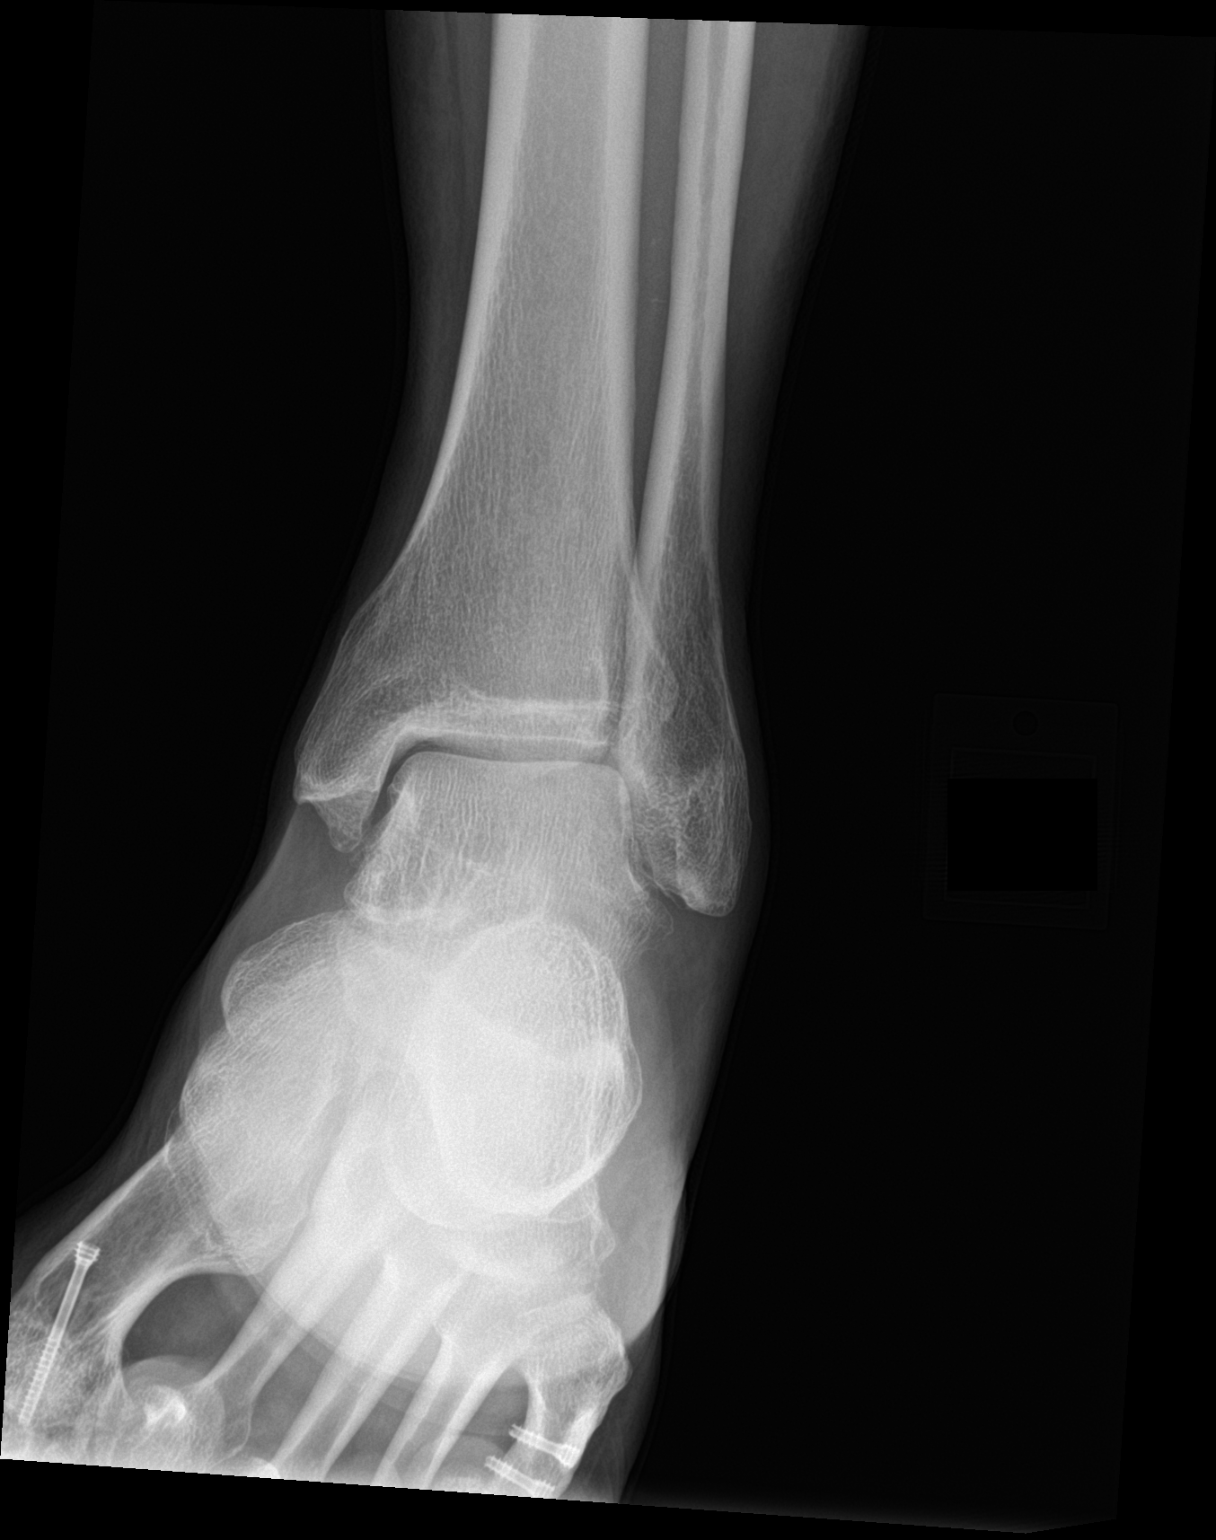

[ankle lat]
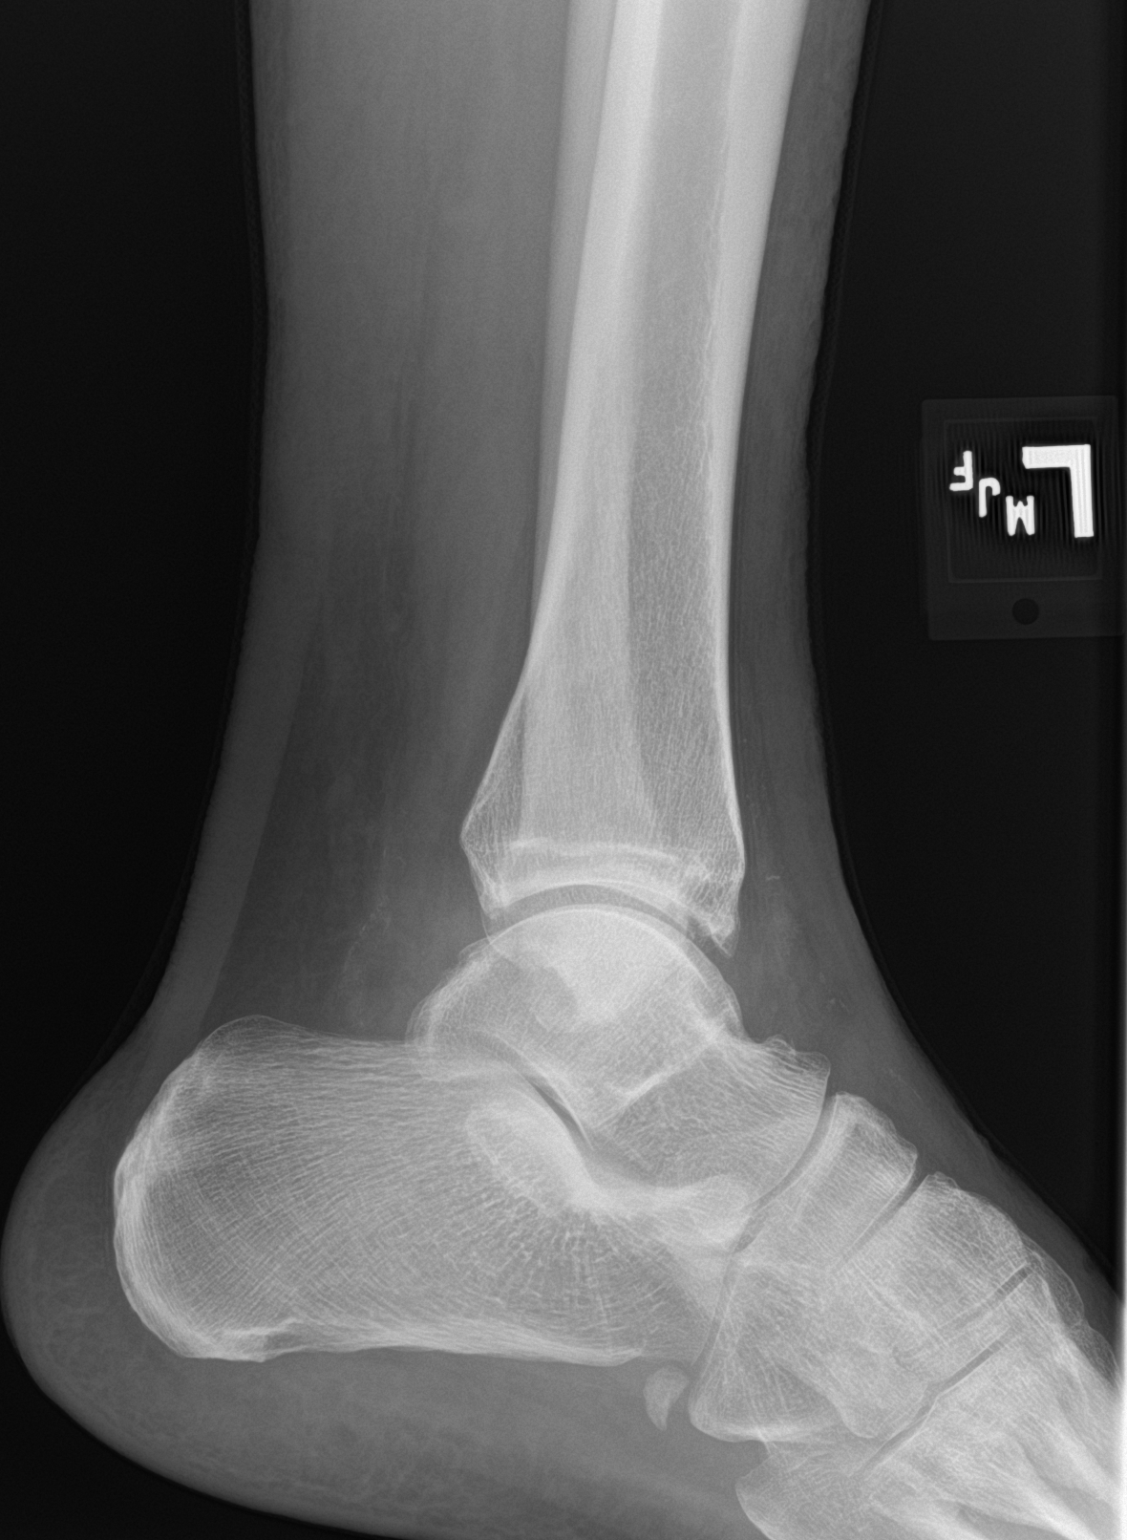

[3 of 3 positions shown; findings below may reference images not displayed]

FINDINGS: Left ankle appears intact. Fixation screws demonstrated in the first
and fifth metatarsal bones. No evidence of acute fracture or
subluxation. No focal bone lesion or bone destruction. Bone cortex
and trabecular architecture appear intact. Vascular calcifications.
No radiopaque soft tissue foreign bodies.
IMPRESSION: No acute bony abnormalities.

## 2023-10-28 ENCOUNTER — Encounter: Payer: Self-pay | Admitting: Emergency Medicine

## 2023-10-28 ENCOUNTER — Emergency Department

## 2023-10-28 ENCOUNTER — Other Ambulatory Visit: Payer: Self-pay

## 2023-10-28 ENCOUNTER — Emergency Department
Admission: EM | Admit: 2023-10-28 | Discharge: 2023-10-28 | Disposition: A | Discharge status: Home / Self Care | Attending: Emergency Medicine | Admitting: Emergency Medicine

## 2023-10-28 DIAGNOSIS — F1990 Other psychoactive substance use, unspecified, uncomplicated: Secondary | ICD-10-CM

## 2023-10-28 DIAGNOSIS — F1490 Cocaine use, unspecified, uncomplicated: Secondary | ICD-10-CM | POA: Insufficient documentation

## 2023-10-28 DIAGNOSIS — R059 Cough, unspecified: Secondary | ICD-10-CM | POA: Diagnosis not present

## 2023-10-28 DIAGNOSIS — R0789 Other chest pain: Secondary | ICD-10-CM | POA: Diagnosis not present

## 2023-10-28 LAB — COMPREHENSIVE METABOLIC PANEL WITH GFR
ALT: 18 U/L (ref 0–44)
AST: 33 U/L (ref 15–41)
Albumin: 3.8 g/dL (ref 3.5–5.0)
Alkaline Phosphatase: 104 U/L (ref 38–126)
Anion gap: 10 (ref 5–15)
BUN: 13 mg/dL (ref 8–23)
CO2: 24 mmol/L (ref 22–32)
Calcium: 8.8 mg/dL — ABNORMAL LOW (ref 8.9–10.3)
Chloride: 102 mmol/L (ref 98–111)
Creatinine, Ser: 1.42 mg/dL — ABNORMAL HIGH (ref 0.61–1.24)
GFR, Estimated: 54 mL/min — ABNORMAL LOW (ref >60)
Glucose, Bld: 95 mg/dL (ref 70–99)
Potassium: 3.6 mmol/L (ref 3.5–5.1)
Sodium: 136 mmol/L (ref 135–145)
Total Bilirubin: 1.2 mg/dL (ref 0.0–1.2)
Total Protein: 7.2 g/dL (ref 6.5–8.1)

## 2023-10-28 LAB — URINE DRUG SCREEN, QUALITATIVE (ARMC ONLY)
Amphetamines, Ur Screen: NOT DETECTED
Barbiturates, Ur Screen: NOT DETECTED
Benzodiazepine, Ur Scrn: NOT DETECTED
Cannabinoid 50 Ng, Ur ~~LOC~~: NOT DETECTED
Cocaine Metabolite,Ur ~~LOC~~: POSITIVE — AB
MDMA (Ecstasy)Ur Screen: NOT DETECTED
Methadone Scn, Ur: NOT DETECTED
Opiate, Ur Screen: NOT DETECTED
Phencyclidine (PCP) Ur S: NOT DETECTED
Tricyclic, Ur Screen: NOT DETECTED

## 2023-10-28 LAB — CBC WITH DIFFERENTIAL/PLATELET
Abs Immature Granulocytes: 0.06 10*3/uL (ref 0.00–0.07)
Basophils Absolute: 0 10*3/uL (ref 0.0–0.1)
Basophils Relative: 0 %
Eosinophils Absolute: 0 10*3/uL (ref 0.0–0.5)
Eosinophils Relative: 0 %
HCT: 41.9 % (ref 39.0–52.0)
Hemoglobin: 14 g/dL (ref 13.0–17.0)
Immature Granulocytes: 1 %
Lymphocytes Relative: 3 %
Lymphs Abs: 0.3 10*3/uL — ABNORMAL LOW (ref 0.7–4.0)
MCH: 30 pg (ref 26.0–34.0)
MCHC: 33.4 g/dL (ref 30.0–36.0)
MCV: 89.9 fL (ref 80.0–100.0)
Monocytes Absolute: 0 10*3/uL — ABNORMAL LOW (ref 0.1–1.0)
Monocytes Relative: 0 %
Neutro Abs: 10.5 10*3/uL — ABNORMAL HIGH (ref 1.7–7.7)
Neutrophils Relative %: 96 %
Platelets: 165 10*3/uL (ref 150–400)
RBC: 4.66 MIL/uL (ref 4.22–5.81)
RDW: 13.7 % (ref 11.5–15.5)
Smear Review: NORMAL
WBC: 10.9 10*3/uL — ABNORMAL HIGH (ref 4.0–10.5)
nRBC: 0 % (ref 0.0–0.2)

## 2023-10-28 LAB — TROPONIN I (HIGH SENSITIVITY)
Troponin I (High Sensitivity): 15 ng/L (ref <18)
Troponin I (High Sensitivity): 12 ng/L (ref <18)

## 2023-10-28 MED ORDER — LORAZEPAM 2 MG/ML IJ SOLN: 1.0000 mg | Freq: Once | INTRAMUSCULAR | Status: DC

## 2023-10-28 MED ORDER — SODIUM CHLORIDE 0.9 % IV BOLUS
1000.0000 mL | Freq: Once | INTRAVENOUS | Status: AC
  Administered 2023-10-28: 1000 mL via INTRAVENOUS

## 2023-10-28 MED ORDER — LORAZEPAM 1 MG PO TABS
1.0000 mg | ORAL_TABLET | Freq: Once | ORAL | Status: AC
  Administered 2023-10-28: 1 mg via ORAL
  Filled 2023-10-28: qty 1

## 2023-10-28 NOTE — ED Triage Notes (Signed)
 Pt in with multiple complaints, states he began to have "shakes" about 30 min ago, then developed L chest pain, diarrhea, chills and vomiting. Endorses crack use 1.5hrs ago

## 2023-10-28 NOTE — ED Notes (Signed)
 Pt d/c home per EDP order. Discharge summary reviewed, pt verbalizes understanding. NAD. Reports discharge ride home.

## 2023-10-28 NOTE — ED Provider Notes (Signed)
 [facility] Provider Note    Event Date/Time   First MD Initiated Contact with Patient 10/28/23 0715     (approximate)  History   Chief Complaint: Chest Pain, Diarrhea, and Chills  HPI  James RIEDINGER Sr. is a 68 y.o. male who presents to the emergency department with complaints of chest pain chills approximately 1.5 hours after using crack cocaine.  According to the patient he has not slept tonight states he had a few beers and admits to using cocaine approximately 1-1 and half hours prior to presentation.  Patient states he smoked it as well as injected in the left upper extremity.  Patient states shortly afterwards he began experiencing shakes/chills, chest pain and had a slight cough.  Patient was concerned so he came to the emergency department for evaluation.  Continues to state mild chest tightness, continues to feel "shaky."  Physical Exam   Triage Vital Signs: ED Triage Vitals  Encounter Vitals Group     BP 10/28/23 0709 104/71     Systolic BP Percentile --      Diastolic BP Percentile --      Pulse Rate 10/28/23 0708 (!) 112     Resp 10/28/23 0708 (!) 24     Temp 10/28/23 0708 99.5 F (37.5 C)     Temp Source 10/28/23 0708 Oral     SpO2 10/28/23 0708 97 %     Weight 10/28/23 0706 197 lb 1.5 oz (89.4 kg)     Height --      Head Circumference --      Peak Flow --      Pain Score 10/28/23 0706 10     Pain Loc --      Pain Education --      Exclude from Growth Chart --     Most recent vital signs: Vitals:   10/28/23 0709 10/28/23 0726  BP: 104/71 106/70  Pulse:  (!) 110  Resp:  20  Temp:    SpO2:  100%    General: Awake, no distress.  CV:  Good peripheral perfusion.  Regular rate and rhythm  Resp:  Normal effort.  Equal breath sounds bilaterally.  Abd:  No distention.  Soft, nontender.  No rebound or guarding.  ED Results / Procedures / Treatments   EKG  EKG viewed and interpreted by myself shows sinus tachycardia 116 bpm with  a narrow QRS, normal axis, normal intervals, nonspecific ST changes, with inferolateral T wave inversions.  I reviewed the patient's last EKG from 2014 and appears to have inferolateral changes at that time as well although less pronounced.  RADIOLOGY  I have reviewed and interpreted chest x-ray images.  No obvious consolidation on my evaluation awaiting radiology read. Chest x-ray read as negative.  MEDICATIONS ORDERED IN ED: Medications  sodium chloride 0.9 % bolus 1,000 mL (1,000 mLs Intravenous New Bag/Given 10/28/23 0741)  LORazepam (ATIVAN) tablet 1 mg (1 mg Oral Given 10/28/23 0742)     IMPRESSION / MDM / ASSESSMENT AND PLAN / ED COURSE  I reviewed the triage vital signs and the nursing notes.  Patient's presentation is most consistent with acute presentation with potential threat to life or bodily function.  Patient presents to the emergency department with feeling shaky, having chest pain and occasional cough after using cocaine both injection and smoking.  Patient also states he has not slept overnight and does admit to alcohol as well.  We will check labs including cardiac enzymes will  obtain a chest x-ray we will IV hydrate.  Patient is somewhat tachycardic likely related to recent substance use we will dose 1 mg of oral Ativan in addition to the IV fluids while awaiting lab and imaging results.  Overall the patient appears well, no distress.  Answering questions appropriately and accurately.  Patient's workup is reassuring.  Patient's drug screen is positive for cocaine only.  Patient's cardiac enzymes are negative x 2.  Chemistry is reassuring.  CBC reassuring.  Patient's chest x-ray is clear.  Patient is feeling better after medication and is resting comfortably.  Will discharge patient home have the patient follow-up with his doctor.  FINAL CLINICAL IMPRESSION(S) / ED DIAGNOSES   Substance use    Note:  This document was prepared using Dragon voice recognition software and  may include unintentional dictation errors.   Ruth Cove, MD 10/28/23 1115

## 2023-10-28 NOTE — ED Notes (Signed)
 Pt requested a diaper to wear. Gave one
